# Patient Record
Sex: Female | Born: 1970
Health system: Southern US, Community
[De-identification: ages and names within clinical notes are randomized; demographics above are authoritative.]

---

## 2001-03-29 ENCOUNTER — Encounter: Payer: Self-pay | Admitting: *Deleted

## 2001-03-29 ENCOUNTER — Emergency Department (HOSPITAL_COMMUNITY): Admission: EM | Admit: 2001-03-29 | Discharge: 2001-03-29 | Payer: Self-pay | Admitting: *Deleted

## 2001-12-03 ENCOUNTER — Other Ambulatory Visit: Admission: RE | Admit: 2001-12-03 | Discharge: 2001-12-03 | Payer: Self-pay

## 2002-04-09 ENCOUNTER — Emergency Department (HOSPITAL_COMMUNITY): Admission: EM | Admit: 2002-04-09 | Discharge: 2002-04-09 | Payer: Self-pay | Admitting: Emergency Medicine

## 2002-04-11 ENCOUNTER — Emergency Department (HOSPITAL_COMMUNITY): Admission: EM | Admit: 2002-04-11 | Discharge: 2002-04-11 | Payer: Self-pay | Admitting: Internal Medicine

## 2002-05-31 ENCOUNTER — Emergency Department (HOSPITAL_COMMUNITY): Admission: EM | Admit: 2002-05-31 | Discharge: 2002-05-31 | Payer: Self-pay | Admitting: Internal Medicine

## 2002-08-08 ENCOUNTER — Emergency Department (HOSPITAL_COMMUNITY): Admission: EM | Admit: 2002-08-08 | Discharge: 2002-08-08 | Payer: Self-pay | Admitting: *Deleted

## 2003-07-11 ENCOUNTER — Emergency Department (HOSPITAL_COMMUNITY): Admission: EM | Admit: 2003-07-11 | Discharge: 2003-07-11 | Payer: Self-pay | Admitting: Emergency Medicine

## 2003-07-12 ENCOUNTER — Emergency Department (HOSPITAL_COMMUNITY): Admission: EM | Admit: 2003-07-12 | Discharge: 2003-07-12 | Payer: Self-pay | Admitting: *Deleted

## 2009-10-15 ENCOUNTER — Emergency Department (HOSPITAL_COMMUNITY): Admission: EM | Admit: 2009-10-15 | Discharge: 2009-10-16 | Payer: Self-pay | Admitting: Emergency Medicine
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
## 2013-02-27 ENCOUNTER — Encounter (HOSPITAL_COMMUNITY): Payer: Self-pay | Admitting: Emergency Medicine

## 2013-02-27 ENCOUNTER — Emergency Department (INDEPENDENT_AMBULATORY_CARE_PROVIDER_SITE_OTHER): Payer: BC Managed Care – PPO

## 2013-02-27 ENCOUNTER — Emergency Department (HOSPITAL_COMMUNITY)
Admission: EM | Admit: 2013-02-27 | Discharge: 2013-02-27 | Disposition: A | Discharge status: Home / Self Care | Payer: BC Managed Care – PPO | Source: Home / Self Care | Attending: Emergency Medicine | Admitting: Emergency Medicine

## 2013-02-27 DIAGNOSIS — T148XXA Other injury of unspecified body region, initial encounter: Secondary | ICD-10-CM

## 2013-02-27 DIAGNOSIS — IMO0002 Reserved for concepts with insufficient information to code with codable children: Secondary | ICD-10-CM

## 2013-02-27 MED ORDER — SODIUM BICARBONATE 4 % IV SOLN
INTRAVENOUS | Status: AC
  Filled 2013-02-27: qty 5

## 2013-02-27 NOTE — ED Provider Notes (Signed)
Chief Complaint:   Chief Complaint  Patient presents with  . Extremity Laceration    History of Present Illness:   Jennifer Savage is a 42 year old female who lacerated her right thumb on a piece of broken glass just prior to coming here today. She has a U-shaped flap laceration at the base of the right thumb dorsally. She's able to fully extend the thumb and denies any numbness or tingling. Her last tetanus vaccine was 2009.  Review of Systems:  Other than noted above, the patient denies any of the following symptoms: Systemic:  No fever or chills. Musculoskeletal:  No joint pain or decreased range of motion. Neuro:  No numbness, tingling, or weakness.  PMFSH:  Past medical history, family history, social history, meds, and allergies were reviewed. She is allergic to sulfa. She takes Prempro.  Physical Exam:   Vital signs:  There were no vitals taken for this visit. Ext:  There is a 1.5 cm U-shaped flap laceration at the base of the right thumb dorsally, just proximal to the MCP joint. She is able to fully extend the thumb. Sensation is intact.  All other joints had a full ROM without pain.  Pulses were full.  Good capillary refill in all digits.  No edema. Neurological:  Alert and oriented.  No muscle weakness.  Sensation was intact to light touch.   Procedure: Verbal informed consent was obtained.  The patient was informed of the risks and benefits of the procedure and understands and accepts.  Identity of the patient was verified verbally and by wristband.   The laceration area described above was prepped with Betadine and saline  and anesthetized with 5 mL of 2% Xylocaine which was buffered with 0.5 mL of sodium bicarbonate.  The wound was then closed as follows:  Wound edges were approximated with 6 5-0 nylon sutures. While was being sewed up the patient became dizzy and passed out briefly for about a minute. She came to immediately. The hand was then reprepped and redraped. The procedure  was finished. There were no other immediate complications, and the patient tolerated the procedure well. The laceration was then cleansed, Bacitracin ointment was applied and a clean, dry pressure dressing was put on.   Assessment:  The encounter diagnosis was Laceration.  No evidence of tendon laceration.  Plan:   1.  The following meds were prescribed:   Discharge Medication List as of 02/27/2013 11:13 AM     2.  The patient was instructed in wound care and pain control, and handouts were given. 3.  The patient was told to return in 14 days for suture removal or wound recheck or sooner if any sign of infection.     Reuben Likes, MD 02/27/13 [phone]

## 2013-02-27 NOTE — ED Notes (Signed)
Pt c/o laceration on right hand onset this morning. Pt states that she was lifting a glass window and cut her thumb. Bleeding has been controlled. Pt is alert and in no acute distress.
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
## 2015-11-05 DIAGNOSIS — Z6822 Body mass index (BMI) 22.0-22.9, adult: Secondary | ICD-10-CM | POA: Diagnosis not present

## 2015-11-05 DIAGNOSIS — R300 Dysuria: Secondary | ICD-10-CM | POA: Diagnosis not present

## 2016-06-29 DIAGNOSIS — Z01419 Encounter for gynecological examination (general) (routine) without abnormal findings: Secondary | ICD-10-CM | POA: Diagnosis not present

## 2016-06-29 DIAGNOSIS — Z6823 Body mass index (BMI) 23.0-23.9, adult: Secondary | ICD-10-CM | POA: Diagnosis not present

## 2016-07-17 DIAGNOSIS — N3001 Acute cystitis with hematuria: Secondary | ICD-10-CM | POA: Diagnosis not present

## 2016-07-17 DIAGNOSIS — Z6822 Body mass index (BMI) 22.0-22.9, adult: Secondary | ICD-10-CM | POA: Diagnosis not present

## 2016-07-17 DIAGNOSIS — R300 Dysuria: Secondary | ICD-10-CM | POA: Diagnosis not present

## 2017-02-22 DIAGNOSIS — Z6822 Body mass index (BMI) 22.0-22.9, adult: Secondary | ICD-10-CM | POA: Diagnosis not present

## 2017-02-22 DIAGNOSIS — J0100 Acute maxillary sinusitis, unspecified: Secondary | ICD-10-CM | POA: Diagnosis not present

## 2017-06-07 DIAGNOSIS — R300 Dysuria: Secondary | ICD-10-CM | POA: Diagnosis not present

## 2017-06-07 DIAGNOSIS — Z6821 Body mass index (BMI) 21.0-21.9, adult: Secondary | ICD-10-CM | POA: Diagnosis not present

## 2017-07-02 DIAGNOSIS — Z01419 Encounter for gynecological examination (general) (routine) without abnormal findings: Secondary | ICD-10-CM | POA: Diagnosis not present

## 2017-07-02 DIAGNOSIS — Z6822 Body mass index (BMI) 22.0-22.9, adult: Secondary | ICD-10-CM | POA: Diagnosis not present

## 2018-01-01 DIAGNOSIS — L989 Disorder of the skin and subcutaneous tissue, unspecified: Secondary | ICD-10-CM | POA: Diagnosis not present

## 2018-03-05 DIAGNOSIS — Z6823 Body mass index (BMI) 23.0-23.9, adult: Secondary | ICD-10-CM | POA: Diagnosis not present

## 2018-03-05 DIAGNOSIS — R350 Frequency of micturition: Secondary | ICD-10-CM | POA: Diagnosis not present

## 2018-03-05 DIAGNOSIS — R5383 Other fatigue: Secondary | ICD-10-CM | POA: Diagnosis not present

## 2018-05-06 DIAGNOSIS — Z6822 Body mass index (BMI) 22.0-22.9, adult: Secondary | ICD-10-CM | POA: Diagnosis not present

## 2018-05-06 DIAGNOSIS — R05 Cough: Secondary | ICD-10-CM | POA: Diagnosis not present

## 2018-06-01 DIAGNOSIS — H40033 Anatomical narrow angle, bilateral: Secondary | ICD-10-CM | POA: Diagnosis not present

## 2018-06-01 DIAGNOSIS — H04123 Dry eye syndrome of bilateral lacrimal glands: Secondary | ICD-10-CM | POA: Diagnosis not present

## 2018-07-08 DIAGNOSIS — Z01419 Encounter for gynecological examination (general) (routine) without abnormal findings: Secondary | ICD-10-CM | POA: Diagnosis not present

## 2018-07-08 DIAGNOSIS — Z6824 Body mass index (BMI) 24.0-24.9, adult: Secondary | ICD-10-CM | POA: Diagnosis not present

## 2018-11-11 DIAGNOSIS — Z6824 Body mass index (BMI) 24.0-24.9, adult: Secondary | ICD-10-CM | POA: Diagnosis not present

## 2018-11-11 DIAGNOSIS — N390 Urinary tract infection, site not specified: Secondary | ICD-10-CM | POA: Diagnosis not present

## 2019-04-03 DIAGNOSIS — L500 Allergic urticaria: Secondary | ICD-10-CM | POA: Diagnosis not present

## 2019-05-16 DIAGNOSIS — L500 Allergic urticaria: Secondary | ICD-10-CM | POA: Diagnosis not present

## 2019-07-02 ENCOUNTER — Encounter: Payer: Self-pay | Admitting: Allergy & Immunology

## 2019-07-02 ENCOUNTER — Other Ambulatory Visit: Payer: Self-pay

## 2019-07-02 ENCOUNTER — Ambulatory Visit (INDEPENDENT_AMBULATORY_CARE_PROVIDER_SITE_OTHER): Payer: BC Managed Care – PPO | Admitting: Allergy & Immunology

## 2019-07-02 VITALS — BP 142/98 | HR 93 | Temp 97.9°F | Resp 18 | Ht 66.0 in | Wt 142.4 lb

## 2019-07-02 DIAGNOSIS — J3089 Other allergic rhinitis: Secondary | ICD-10-CM

## 2019-07-02 DIAGNOSIS — J302 Other seasonal allergic rhinitis: Secondary | ICD-10-CM | POA: Diagnosis not present

## 2019-07-02 DIAGNOSIS — L508 Other urticaria: Secondary | ICD-10-CM | POA: Diagnosis not present

## 2019-07-02 HISTORY — DX: Other urticaria: L50.8

## 2019-07-02 NOTE — Progress Notes (Signed)
NEW PATIENT  Date of Service/Encounter:  07/02/19  Referring provider: Patient, No Pcp Per   Assessment:   Chronic urticaria - controlled with antihistamine daily   Seasonal and perennial allergic rhinitis (grasses, weeds, trees, dust mite)   Jennifer Savage presents for an evaluation of pruritus and chronic urticaria.  She has marked dermatographia some today on exam and sensitizations to pollens as well as dust mites.  If allergic triggers are involved, I would conjecture that dust mites are the triggering agent.  We are can get some lab work to rule out serious causes of hives and itching.  Her symptoms are thankfully controlled with an antihistamine daily.  I do not think we are going to have to advance to Xolair, but we can leave that as an option in the future if needed.  For her allergic rhinitis, her antihistamines alone seem to be controlling symptoms.  I do not think allergy shots would be indicated or all that helpful at this point.  Plan/Recommendations:   1. Chronic urticaria - Your history does not have any "red flags" such as fevers, joint pains, or permanent skin changes that would be concerning for a more serious cause of hives.  - We will get some labs to rule out serious causes of hives: alpha gal panel, complete blood count, tryptase level, chronic urticaria panel, CMP, ESR, and CRP. - We are ordering labs, so please allow 1-2 weeks for the results to come back. - With the newly implemented Cures Act, the labs might be visible to you at the same time that they become visible to me. - However, I will not address the results until all of the results come  back, so please be patient.  - In the meantime, continue avoiding your triggering food(s) in your After Visit Summary, including avoidance measures (if applicable), until you hear from me about the results.    - Chronic hives are often times a self limited process and will "burn themselves out" over 6-12 months, although this is  not always the case.  - In the meantime, start suppressive dosing of antihistamines:  - Evening: Zyrtec (cetirizine) 10-'20mg'$  (one or tablets) + Pepcid (famotidine) '20mg'$   - If the above is not working, try adding: Pepcid (famotidine) '20mg'$  - You can change this dosing at home, decreasing the dose as needed or increasing the dosing as needed.  - If you are not tolerating the medications or are tired of taking them every day, we can start treatment with a monthly injectable medication called Xolair.   2. Seasonal and perennial allergic rhinitis (grasses, weeds, trees, dust mite) - Antihistamines as above.  - Could consider the use of nasal sprays. - I am unsure that allergy shots are warranted at this time.   3. Return in about 4 weeks (around 07/30/2019). This can be an in-person, a virtual Webex or a telephone follow up visit.  Subjective:   Jennifer Savage is a 48 y.o. female presenting today for evaluation of  Chief Complaint  Patient presents with  . Pruritis    Started with face with mask wearing. Now everywhere.    Jennifer Savage has a history of the following: Patient Active Problem List   Diagnosis Date Noted  . Chronic urticaria 07/02/2019  . Seasonal and perennial allergic rhinitis 07/02/2019    History obtained from: chart review and patient.  Jennifer Savage was referred by Patient, No Pcp Per.     Jennifer Savage is a 48 y.o. female presenting for an  evaluation of itching. She has had this ongoing for several months. She went to see her PCP in September for her symptoms. Patient attributes her local pruritis to her mask, and she has tried wearing several different kinds, but her symptoms continue to persist. It is difficult to gauge whether the itching improved over the weekends when she was not wearing her mask. She states that her pruritis has progressed to her entire body. She states that her pruritis is present at home without wearing her mask for over 24-48 hours. She does not endorse  hives, pustules, or bullae formation. However, she did develop some dermatographism even in the room when we were visiting with her.   She has tried to alleviate her itching with honey oat bars, to no avail. She has tried Zyrtec and famotidine daily, with partial relief, but her symptoms come back with cessation of her medications. However, when she has been taking an antihistamine at night, she does prevent outbreaks from occurring. But she does not want to take anything on a daily basis.   She tolerates all of the major food allergens without adverse event.  She has never noticed any worsening of her rash from ingestion of any particular food.   She endorses seasonal allergies of rhinitis and conjunctival injection which worsens in the Fall and Winter seasons. She denies SHOB, wheezing, voice changes, or difficulty swallowing during her episodes. 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 She states she is the only one at work.   Otherwise, there is no history of other atopic diseases, including asthma, food allergies, drug allergies, stinging insect allergies, eczema or contact dermatitis. There is no significant infectious history. Vaccinations are up to date.    Past Medical History: Patient Active Problem List   Diagnosis Date Noted  . Chronic urticaria 07/02/2019  . Seasonal and perennial allergic rhinitis 07/02/2019    Medication List:  Allergies as of 07/02/2019      Reactions   Sulfur       Medication List       Accurate as of July 02, 2019 10:14 AM. If you have any questions, ask your nurse or doctor.        estrogen (conjugated)-medroxyprogesterone 0.625-2.5 MG tablet Commonly known as: PREMPRO Take 1 tablet by mouth daily.       Birth History: non-contributory  Developmental History: non-contributory  Past Surgical History: History reviewed. No pertinent surgical history.   Family History: Family History  Problem  Relation Age of Onset  . Allergic rhinitis Neg Hx   . Angioedema Neg Hx   . Asthma Neg Hx   . Atopy Neg Hx   . Eczema Neg Hx   . Immunodeficiency Neg Hx   . Urticaria Neg Hx      Social History: Jennifer Savage lives at home with her family.  They live in a house that is 46 years old.  There is wood throughout the home.  They have electric heating and a heat pump for cooling.  There are 3 dogs in the home.  There are no dust mite covers on the bedding.  There is no tobacco exposure.  She works as an Glass blower/designer in [employer].  She is typically the only 1 in the office.   Review of Systems  Constitutional: Negative.  Negative for chills, fever, malaise/fatigue and weight loss.  HENT: Negative.  Negative for congestion, ear discharge and ear pain.   Eyes: Negative for pain,  discharge and redness.  Respiratory: Negative for cough, sputum production, shortness of breath and wheezing.   Cardiovascular: Negative.  Negative for chest pain and palpitations.  Gastrointestinal: Negative for abdominal pain, constipation, diarrhea, heartburn, nausea and vomiting.  Skin: Positive for rash. Negative for itching.  Neurological: Negative for dizziness and headaches.  Endo/Heme/Allergies: Positive for environmental allergies. Does not bruise/bleed easily.       Objective:   Blood pressure (!) 142/98, pulse 93, temperature 97.9 F (36.6 C), temperature source Temporal, resp. rate 18, height '5\' 6"'$  (1.676 m), weight 142 lb 6.4 oz (64.6 kg), SpO2 96 %. Body mass index is 22.98 kg/m.   Physical Exam:   Physical Exam  Constitutional: She appears well-developed.  Patient sitting in chair, continually scratching hands, hair, and neck. Linear excoriation marks with urticaria and erythema present along the neck.   HENT:  Head: Normocephalic and atraumatic.  Right Ear: Tympanic membrane, external ear and ear canal normal. No drainage, swelling or tenderness. Tympanic membrane is not injected,  not scarred, not erythematous, not retracted and not bulging.  Left Ear: Tympanic membrane, external ear and ear canal normal. No drainage, swelling or tenderness. Tympanic membrane is not injected, not scarred, not erythematous, not retracted and not bulging.  Nose: Mucosal edema and rhinorrhea present. No nasal deformity or septal deviation. No epistaxis. Right sinus exhibits no maxillary sinus tenderness and no frontal sinus tenderness. Left sinus exhibits no maxillary sinus tenderness and no frontal sinus tenderness.  Mouth/Throat: Uvula is midline and oropharynx is clear and moist. Mucous membranes are not pale and not dry.  Eyes: Pupils are equal, round, and reactive to light. Conjunctivae and EOM are normal. Right eye exhibits no chemosis and no discharge. Left eye exhibits no chemosis and no discharge. Right conjunctiva is not injected. Left conjunctiva is not injected.  Cardiovascular: Normal rate, regular rhythm and normal heart sounds.  Respiratory: Effort normal and breath sounds normal. No accessory muscle usage. No tachypnea. No respiratory distress. She has no wheezes. She has no rhonchi. She has no rales. She exhibits no tenderness.  GI: There is no abdominal tenderness. There is no rebound and no guarding.  Lymphadenopathy:       Head (right side): No submandibular, no tonsillar and no occipital adenopathy present.       Head (left side): No submandibular, no tonsillar and no occipital adenopathy present.    She has no cervical adenopathy.  Neurological: She is alert.  Skin: No abrasion, no petechiae and no rash noted. Rash is not papular, not vesicular and not urticarial. No erythema. No pallor.  She does have excoriation marks present across the face and neck. There is some dermatographism present on her neck predominantly.   Psychiatric: She has a normal mood and affect.     Diagnostic studies:     Allergy Studies:    Airborne Adult Perc - 07/02/19 0911    Allergen  Manufacturer  Lavella Hammock    Location  Back    Number of Test  59    Panel 1  Select    1. Control-Buffer 50% Glycerol  Negative    2. Control-Histamine 1 mg/ml  2+    3. Albumin saline  Negative    4. Ricketts  Negative    5. [place]  2+    6. Johnson  2+    7. Kentucky Blue  2+    8. Meadow Fescue  2+    9. Perennial Rye  Negative    10.  Sweet Vernal  Negative    11. Timothy  Negative    12. Cocklebur  Negative    13. Burweed Marshelder  3+    14. Ragweed, short  Negative    15. Ragweed, Giant  Negative    16. Plantain,  English  3+    17. Lamb's Quarters  Negative    18. Sheep Sorrell  Negative    19. Rough Pigweed  3+    20. Marsh Elder, Rough  Negative    21. Mugwort, Common  Negative    22. Ash mix  Negative    23. Birch mix  3+    24. Beech American  Negative    25. Box, Elder  Negative    26. Cedar, red  Negative    27. Cottonwood, [place]  Negative    28. Elm mix  Negative    29. Hickory mix  Negative    30. Maple mix  Negative    31. Oak, [place] mix  Negative    32. Pecan Pollen  Negative    33. Pine mix  Negative    34. Sycamore Eastern  Negative    35. Hudson, Black Pollen  Negative    36. Alternaria alternata  Negative    37. Cladosporium Herbarum  Negative    38. Aspergillus mix  Negative    39. Penicillium mix  Negative    40. Bipolaris sorokiniana (Helminthosporium)  Negative    41. Drechslera spicifera (Curvularia)  Negative    42. Mucor plumbeus  Negative    43. Fusarium moniliforme  Negative    44. Aureobasidium pullulans (pullulara)  Negative    45. Rhizopus oryzae  Negative    46. Botrytis cinera  Negative    47. Epicoccum nigrum  Negative    48. Phoma betae  Negative    49. Candida Albicans  Negative    50. Trichophyton mentagrophytes  Negative    51. Mite, D Farinae  5,000 AU/ml  3+    52. Mite, D Pteronyssinus  5,000 AU/ml  3+    53. Cat Hair 10,000 BAU/ml  Negative    54.  Dog Epithelia  Negative    55. Mixed Feathers  Negative    56. Horse  Epithelia  Negative    57. Cockroach, German  Negative    58. Mouse  Negative    59. Tobacco Leaf  Negative       Allergy testing results were read and interpreted by myself, documented by clinical staff.         Salvatore Marvel, MD [facility]

## 2019-07-02 NOTE — Patient Instructions (Addendum)
1. Chronic urticaria - Your history does not have any "red flags" such as fevers, joint pains, or permanent skin changes that would be concerning for a more serious cause of hives.  - We will get some labs to rule out serious causes of hives: alpha gal panel, complete blood count, tryptase level, chronic urticaria panel, CMP, ESR, and CRP. - We are ordering labs, so please allow 1-2 weeks for the results to come back. - With the newly implemented Cures Act, the labs might be visible to you at the same time that they become visible to me. - However, I will not address the results until all of the results come  back, so please be patient.  - In the meantime, continue avoiding your triggering food(s) in your After Visit Summary, including avoidance measures (if applicable), until you hear from me about the results.    - Chronic hives are often times a self limited process and will "burn themselves out" over 6-12 months, although this is not always the case.  - In the meantime, start suppressive dosing of antihistamines:  - Evening: Zyrtec (cetirizine) 10-76m (one or tablets) + Pepcid (famotidine) 237m - If the above is not working, try adding: Pepcid (famotidine) 2041m You can change this dosing at home, decreasing the dose as needed or increasing the dosing as needed.  - If you are not tolerating the medications or are tired of taking them every day, we can start treatment with a monthly injectable medication called Xolair.   2. Return in about 4 weeks (around 07/30/2019). This can be an in-person, a virtual Webex or a telephone follow up visit.   Please inform us [place] any Emergency Department visits, hospitalizations, or changes in symptoms. Call us [place] going to the ED for breathing or allergy symptoms since we might be able to fit you in for a sick visit. Feel free to contact us [place] with any questions, problems, or concerns.  It was a pleasure to see you again today!  Websites that have  reliable patient information: 1. American Academy of Asthma, Allergy, and Immunology: www.aaaai.org 2. Food Allergy Research and Education (FARE): foodallergy.org 3. Mothers of Asthmatics: http://www.asthmacommunitynetwork.org 4. American College of Allergy, Asthma, and Immunology: www.acaai.org  "Like" us [place] Facebook and Instagram for our latest updates!      Make sure you are registered to vote! If you have moved or changed any of your contact information, you will need to get this updated before voting!  In some cases, you MAY be able to register to vote online: [email or web address]

## 2019-07-11 LAB — ANA W/REFLEX IF POSITIVE: Anti Nuclear Antibody (ANA): NEGATIVE

## 2019-07-11 LAB — SEDIMENTATION RATE: Sed Rate: 8 mm/hr (ref 0–32)

## 2019-07-11 LAB — ALPHA-GAL PANEL
Alpha Gal IgE*: 0.83 kU/L — ABNORMAL HIGH (ref <0.10)
Beef (Bos spp) IgE: 0.3 kU/L (ref <0.35)
Class Interpretation: 0
Lamb/Mutton (Ovis spp) IgE: <0.1 kU/L (ref <0.35)
Pork (Sus spp) IgE: 0.19 kU/L (ref <0.35)

## 2019-07-11 LAB — THYROID ANTIBODIES
Thyroglobulin Antibody: <1 IU/mL (ref 0.0–0.9)
Thyroperoxidase Ab SerPl-aCnc: <9 IU/mL (ref 0–34)

## 2019-07-11 LAB — C-REACTIVE PROTEIN: CRP: 2 mg/L (ref 0–10)

## 2019-07-11 LAB — TRYPTASE: Tryptase: 4.4 ug/L (ref 2.2–13.2)

## 2019-07-11 LAB — CHRONIC URTICARIA: cu index: 6.5 (ref <10)

## 2019-07-14 ENCOUNTER — Other Ambulatory Visit: Payer: Self-pay

## 2019-07-14 MED ORDER — EPINEPHRINE 0.3 MG/0.3ML IJ SOAJ: 0.3000 mg | Freq: Once | INTRAMUSCULAR | 1 refills | Status: AC

## 2019-07-15 DIAGNOSIS — Z01419 Encounter for gynecological examination (general) (routine) without abnormal findings: Secondary | ICD-10-CM | POA: Diagnosis not present

## 2019-07-15 DIAGNOSIS — Z6824 Body mass index (BMI) 24.0-24.9, adult: Secondary | ICD-10-CM | POA: Diagnosis not present

## 2019-07-30 ENCOUNTER — Ambulatory Visit: Payer: BC Managed Care – PPO | Admitting: Allergy & Immunology

## 2019-08-11 DIAGNOSIS — R87610 Atypical squamous cells of undetermined significance on cytologic smear of cervix (ASC-US): Secondary | ICD-10-CM | POA: Diagnosis not present

## 2019-08-11 DIAGNOSIS — Z6824 Body mass index (BMI) 24.0-24.9, adult: Secondary | ICD-10-CM | POA: Diagnosis not present

## 2019-08-11 DIAGNOSIS — R87611 Atypical squamous cells cannot exclude high grade squamous intraepithelial lesion on cytologic smear of cervix (ASC-H): Secondary | ICD-10-CM | POA: Diagnosis not present

## 2019-08-18 DIAGNOSIS — Z6823 Body mass index (BMI) 23.0-23.9, adult: Secondary | ICD-10-CM | POA: Diagnosis not present

## 2019-08-18 DIAGNOSIS — N390 Urinary tract infection, site not specified: Secondary | ICD-10-CM | POA: Diagnosis not present

## 2019-08-20 DIAGNOSIS — Z6825 Body mass index (BMI) 25.0-25.9, adult: Secondary | ICD-10-CM | POA: Diagnosis not present

## 2019-08-20 DIAGNOSIS — R87611 Atypical squamous cells cannot exclude high grade squamous intraepithelial lesion on cytologic smear of cervix (ASC-H): Secondary | ICD-10-CM | POA: Diagnosis not present
